# Patient Record
Sex: Female | Born: 1935 | Race: White | Hispanic: No | Marital: Single | Smoking: Former smoker
Health system: Southern US, Community
[De-identification: ages and names within clinical notes are randomized; demographics above are authoritative.]

## PROBLEM LIST (undated history)

## (undated) DIAGNOSIS — E611 Iron deficiency: Secondary | ICD-10-CM

## (undated) DIAGNOSIS — K589 Irritable bowel syndrome without diarrhea: Secondary | ICD-10-CM

## (undated) HISTORY — DX: Irritable bowel syndrome without diarrhea: K58.9

## (undated) HISTORY — DX: Iron deficiency: E61.1

## (undated) HISTORY — PX: OTHER SURGICAL HISTORY: SHX169

---

## 2017-05-06 ENCOUNTER — Other Ambulatory Visit: Payer: Self-pay | Admitting: Gerontology

## 2017-05-06 ENCOUNTER — Ambulatory Visit
Admission: RE | Admit: 2017-05-06 | Discharge: 2017-05-06 | Disposition: A | Discharge status: Home / Self Care | Payer: No Typology Code available for payment source | Source: Ambulatory Visit | Attending: Gerontology | Admitting: Gerontology

## 2017-05-06 DIAGNOSIS — R059 Cough, unspecified: Secondary | ICD-10-CM

## 2017-05-06 DIAGNOSIS — R05 Cough: Secondary | ICD-10-CM

## 2017-06-01 ENCOUNTER — Encounter: Payer: Self-pay | Admitting: Internal Medicine

## 2017-08-24 ENCOUNTER — Encounter: Payer: Self-pay | Admitting: Nurse Practitioner

## 2017-08-24 ENCOUNTER — Ambulatory Visit (INDEPENDENT_AMBULATORY_CARE_PROVIDER_SITE_OTHER): Payer: Medicare (Managed Care) | Admitting: Nurse Practitioner

## 2017-08-24 ENCOUNTER — Encounter

## 2017-08-24 DIAGNOSIS — R1030 Lower abdominal pain, unspecified: Secondary | ICD-10-CM

## 2017-08-24 DIAGNOSIS — R109 Unspecified abdominal pain: Secondary | ICD-10-CM | POA: Insufficient documentation

## 2017-08-24 DIAGNOSIS — R197 Diarrhea, unspecified: Secondary | ICD-10-CM | POA: Diagnosis not present

## 2017-08-24 NOTE — Progress Notes (Signed)
[facility]  [facility] Primary Gastroenterologist:  Dr. Jena Gaussourk  Chief Complaint  Patient presents with  . Diarrhea    10-12 times per day  . Abdominal Pain    all across the abd after meals    HPI:   Makayla Chavez is a 84 y.o. female who presents on referral from primary care loose bowel movements and a history of diverticulosis.  Reviewed information provided with the referral including office visit dated 05/06/2017.  At that time she appeared to be doing well.  Chronic kidney disease with creatinine 1.48 03/16/2017.  Sometime after her office visit consult was sent to our office for diverticulosis, frequent loose stools after meals, urge incontinence..  No history of colonoscopy or endoscopy in our system.  Today she is accompanied by her daughter. Today she states the diarrhea has been chronic. Just moved to the area in September 2018. Her daughter feels her loose stools have been ongoing for 10+ years. She has 12+ stools a day, typically has 3 stools after eating. Also early morning stools. Abdominal pain started about a few months ago; described as crampy, located lower abdomen, improves after bowel movement. Denies hematochezia, melena, fever, chills, unintentional weight loss. She has a history of hemorrhoids and had one episode of mild toilet tissue hematochezia. She is unsure if she's had a colonoscopy before. Her daughter states if she has, it would have been years ago. No recent antibiotics. Was in a nursing home for 6 months until she moved to this area. She has tried Benefiber 2 tsp a day. Denies chest pain, dyspnea, dizziness, lightheadedness, syncope, near syncope. Denies any other upper or lower GI symptoms.   History reviewed. No pertinent past medical history.  Past Surgical History:  Procedure Laterality Date  . NONE TO DATE     As of 08/24/17    Current Outpatient Medications  Medication Sig Dispense Refill  .  acetaminophen (TYLENOL) 650 MG CR tablet Take 650 mg by mouth every 8 (eight) hours as needed for pain.    Marland Kitchen. amLODipine (NORVASC) 5 MG tablet Take 5 mg by mouth daily.    . cholecalciferol (VITAMIN D) 1000 units tablet Take 1,000 Units by mouth daily.    Marland Kitchen. donepezil (ARICEPT) 10 MG tablet Take 10 mg by mouth at bedtime.    . furosemide (LASIX) 20 MG tablet Take 60 mg by mouth daily.    Marland Kitchen. levothyroxine (SYNTHROID, LEVOTHROID) 50 MCG tablet Take 50 mcg by mouth daily before breakfast.    . methotrexate (RHEUMATREX) 2.5 MG tablet Take 2.5 mg by mouth once a week. Caution:Chemotherapy. Protect from light.    . pantoprazole (PROTONIX) 40 MG tablet Take 40 mg by mouth daily.    . pioglitazone (ACTOS) 15 MG tablet Take 15 mg by mouth daily.    . rosuvastatin (CRESTOR) 10 MG tablet Take 10 mg by mouth daily.    . Wheat Dextrin (BENEFIBER PO) Take by mouth. As directed     No current facility-administered medications for this visit.     Allergies as of 08/24/2017 - Review Complete 08/24/2017  Allergen Reaction Noted  . Macrolides and ketolides  08/24/2017  . Sulfur  08/24/2017    Family History  Problem Relation Age of Onset  . Colon cancer Neg Hx     Social History   Socioeconomic History  . Marital status: Single    Spouse name: Not on file  . Number of children: Not on  file  . Years of education: Not on file  . Highest education level: Not on file  Occupational History  . Not on file  Social Needs  . Financial resource strain: Not on file  . Food insecurity:    Worry: Not on file    Inability: Not on file  . Transportation needs:    Medical: Not on file    Non-medical: Not on file  Tobacco Use  . Smoking status: Former Smoker    Types: Cigarettes  . Smokeless tobacco: Never Used  Substance and Sexual Activity  . Alcohol use: Never    Frequency: Never  . Drug use: Never  . Sexual activity: Not on file  Lifestyle  . Physical activity:    Days per week: Not on file     Minutes per session: Not on file  . Stress: Not on file  Relationships  . Social connections:    Talks on phone: Not on file    Gets together: Not on file    Attends religious service: Not on file    Active member of club or organization: Not on file    Attends meetings of clubs or organizations: Not on file    Relationship status: Not on file  . Intimate partner violence:    Fear of current or ex partner: Not on file    Emotionally abused: Not on file    Physically abused: Not on file    Forced sexual activity: Not on file  Other Topics Concern  . Not on file  Social History Narrative  . Not on file    Review of Systems: General: Negative for anorexia, weight loss, fever, chills, fatigue, weakness. ENT: Negative for hoarseness, difficulty swallowing. CV: Negative for chest pain, angina, palpitations, peripheral edema.  Respiratory: Negative for dyspnea at rest, cough, sputum, wheezing.  GI: See history of present illness. GU:  History of frequent UTIs.  MS: Negative for joint pain, low back pain.  Derm: Negative for rash or itching.  Endo: Negative for unusual weight change.  Heme: Negative for bruising or bleeding. Allergy: Negative for rash or hives.    Physical Exam: BP 128/77   Pulse 88   Temp 98.2 F (36.8 C) (Oral)   Ht 5\' 1"  (1.549 m)   Wt 176 lb 9.6 oz (80.1 kg)   BMI 33.37 kg/m  General:   Alert and oriented. Pleasant and cooperative. Well-nourished and well-developed.  Eyes:  Without icterus, sclera clear and conjunctiva pink.  Ears:  Normal auditory acuity. Cardiovascular:  S1, S2 present without murmurs appreciated. Extremities without clubbing or edema. Respiratory:  Clear to auscultation bilaterally. No wheezes, rales, or rhonchi. No distress.  Gastrointestinal:  +BS, soft, non-tender and non-distended. No HSM noted. No guarding or rebound. No masses appreciated.  Rectal:  Deferred  Musculoskalatal:  Symmetrical without gross  deformities. Neurologic:  Alert and oriented;  grossly normal neurologically. Psych:  Alert and cooperative. Normal mood and affect. Heme/Lymph/Immune: No excessive bruising noted.    08/24/2017 2:49 PM   Disclaimer: This note was dictated with voice recognition software. Similar sounding words can inadvertently be transcribed and may not be corrected upon review.

## 2017-08-24 NOTE — Patient Instructions (Signed)
1. Pick up a stool sample kit from [facility]. 2. Collect a liquid stool sample and bring it back to the lab. 3. We will call you with the results when they are back. 4. If there is no infection I will send in the medication (likely Bentyl) to your pharmacy.  When you start Bentyl you can also try Imodium if needed on top of that. 5. Call us in 1 to 2 weeks after starting Bentyl and let us know if it is helping. 6. We can make further medication changes and adjustments over the phone to help control your symptoms. 7. Return for follow-up in our office in 2 months. 8. Call us if you have any questions or concerns.  At [facility] we value your feedback. You may receive a survey about your visit today. Please share your experience as we strive to create trusting relationships with our patients to provide genuine, compassionate, quality care.  It was great to meet both you today!  I hope you have a great summer!!

## 2017-08-27 LAB — GASTROINTESTINAL PATHOGEN PANEL PCR
C. difficile Tox A/B, PCR: NOT DETECTED
CRYPTOSPORIDIUM, PCR: NOT DETECTED
Campylobacter, PCR: NOT DETECTED
E coli (ETEC) LT/ST PCR: NOT DETECTED
E coli (STEC) stx1/stx2, PCR: NOT DETECTED
E coli 0157, PCR: NOT DETECTED
GIARDIA LAMBLIA, PCR: NOT DETECTED
NOROVIRUS, PCR: NOT DETECTED
ROTAVIRUS, PCR: NOT DETECTED
Salmonella, PCR: NOT DETECTED
Shigella, PCR: NOT DETECTED

## 2017-08-28 NOTE — Assessment & Plan Note (Signed)
Noted abdominal pain lower abdomen described as crampy and improvement after bowel movement.  Is associated with diarrhea, acute on chronic as per above.  If her GI pathogen panel is negative we can trial Bentyl for diarrhea and abdominal pain relief.  Viberzi is an additional option.  Return for follow-up in 2 months.

## 2017-08-28 NOTE — Assessment & Plan Note (Signed)
The patient likely has chronic diarrhea for 10+ years.  She is currently having significant acute on chronic episode with 12+ stools a day, typically 3 stools after eating a meal.  Also early morning stools.  Abdominal pain associated with this in the lower abdomen described as crampy.  She is unsure if she had a colonoscopy before and her daughter states that she has it would be years ago.  No recent antibiotics but has been in a nursing home in the near past.  [place] ServiceBenefiber has not helped.  At this point I will check a GI pathogen panel.  If she has no infection we can treat with Bentyl.  If no improvement on Bentyl, consider Viberzi as a second option.  I requested a progress report 1 to 2 weeks.  Imodium remains another option either in combination or and so low.  Follow-up in 2 months.

## 2017-08-31 ENCOUNTER — Telehealth: Payer: Self-pay | Admitting: Internal Medicine

## 2017-08-31 NOTE — Telephone Encounter (Signed)
PATIENT DAUGHTER CALLED INQUIRING ABOUT HER MOTHERS LAB RESULTS FROM LAST WEEK

## 2017-08-31 NOTE — Telephone Encounter (Signed)
Spoke with daughter, she was notified of results.

## 2017-08-31 NOTE — Telephone Encounter (Signed)
Tried calling, no answer. Waiting on a return call.

## 2017-08-31 NOTE — Progress Notes (Signed)
CC'D TO PCP °

## 2017-09-03 ENCOUNTER — Telehealth: Payer: Self-pay | Admitting: Internal Medicine

## 2017-09-03 NOTE — Telephone Encounter (Signed)
[facility] (Dr Lyla Sonarrie) called asking to speak with EG regarding the patient that he had seen on 08/24/17. Please call her back at [phone]

## 2017-09-03 NOTE — Telephone Encounter (Signed)
EG, Dr. Lyla Sonarrie would like to know the dose and instructions for Bentyl medication.

## 2017-09-03 NOTE — Telephone Encounter (Signed)
Attempted call back. Left message x 1.

## 2017-09-03 NOTE — Telephone Encounter (Signed)
Spoke with Dr. Izetta Dakinarrie Fernald, DNP, AGNP-C about Bentyl and other options ([facility] pays for the patient's medications). She can titrate as needed. Refer back if needed further assistance.

## 2017-10-30 ENCOUNTER — Ambulatory Visit: Payer: Medicare (Managed Care) | Admitting: Nurse Practitioner

## 2017-11-23 ENCOUNTER — Encounter: Payer: Self-pay | Admitting: Nurse Practitioner

## 2017-11-23 ENCOUNTER — Ambulatory Visit (INDEPENDENT_AMBULATORY_CARE_PROVIDER_SITE_OTHER): Payer: Medicare (Managed Care) | Admitting: Nurse Practitioner

## 2017-11-23 VITALS — BP 142/69 | HR 81 | Temp 97.2°F | Ht 61.0 in | Wt 175.4 lb

## 2017-11-23 DIAGNOSIS — R197 Diarrhea, unspecified: Secondary | ICD-10-CM

## 2017-11-23 DIAGNOSIS — R1030 Lower abdominal pain, unspecified: Secondary | ICD-10-CM | POA: Diagnosis not present

## 2017-11-23 NOTE — Patient Instructions (Addendum)
1. We will call and discuss her medications with Izetta Dakin, NP at [facility] 2. I will ask her if she can change your Bentyl to pill form. 3. We will likely increase her Bentyl to see if we can better control her symptoms. 4. Alternatively you could add Imodium as needed. 5. You can attempt elimination diets where you eliminate all dairy and see if this helps her symptoms.  The same goes for artificial sweeteners. 6. Return for follow-up in 3 months. 7. Further recommendations will follow.  At [facility] we value your feedback. You may receive a survey about your visit today. Please share your experience as we strive to create trusting relationships with our patients to provide genuine, compassionate, quality care.  We appreciate your understanding and patience as we review any laboratory studies, imaging, and other diagnostic tests that are ordered as we care for you. Our office policy is 5 business days for review of these results, and any emergent or urgent results are addressed in a timely manner for your best interest. If you do not hear from our office in 1 week, please contact us.   We also encourage the use of MyChart, which contains your medical information for your review as well. If you are not enrolled in this feature, an access code is on this after visit summary for your convenience. Thank you for allowing [place] to be involved in your care.  It was great to see you both today!  I hope you have a great Fall!!

## 2017-11-23 NOTE — Assessment & Plan Note (Signed)
The patient's abdominal pain seems improved.  However, at some point during the visit the patient stated that her abdominal pain is persistent but she cannot quantify how often despite being asked multiple times.  She states it is crampy.  She likely has IBS, as previously discussed.  She is on Bentyl but on pretty low dose.  I will discuss with her nurse practitioner at [facility] about up titrating her medication.  Follow-up in 3 months.

## 2017-11-23 NOTE — Assessment & Plan Note (Signed)
On the surface it appears the patient is doing better with diarrhea.  She told the nurse she is having 1 stool a day.  However, at some point her daughter states that she was doing better and is now on the decline.  She is taking Bentyl 5 mg twice daily in liquid form.  They are requesting pill form.  Her abdominal pain is improved.  The patient's daughter states she is having 3 loose bowel movements after every meal, which seems inconsistent with what was previously stated.  At this point I will call and discuss her situation with a nurse practitioner from [facility].  I will ask if Bentyl can be changed to pill form.  Recommend Bentyl 10 mg 3 times a day before meals.  Also recommended attempt elimination diet with dairy and with artificial sweeteners to see if this has any improvement.  Alternatively she can add Imodium as needed.  Follow-up in 3 months.

## 2017-11-23 NOTE — Progress Notes (Signed)
CC'D TO PCP °

## 2017-11-23 NOTE — Progress Notes (Signed)
Referring Provider: [facility] Primary Care Physician:  [facility] Primary GI:  Dr. Jena Gauss  Chief Complaint  Patient presents with  . Abdominal Pain    no longer an issue  . Diarrhea    once a day, watery stool, no blood    HPI:   Makayla Chavez is a 84 y.o. female who presents for follow-up on abdominal pain.  The patient was last seen in our office 08/24/2017 for lower abdominal pain and diarrhea.  Noted history of chronic kidney disease.  No history of colonoscopy or endoscopy in our system.  At her visit she was accompanied by her daughter.  Diarrhea has been chronic, just moved to the area in September 2018.  Typically 12+ stools a day, 3 stools after meals, this is been ongoing for 10 or more years.  Abdominal pain started a few months ago which is crampy and lower abdominal which improves after bowel movement.  History of hemorrhoids with one previous history of toilet tissue hematochezia.  Unsure if she has had a colonoscopy and her daughter states if so it would be numerous years prior.  Has tried Benefiber 2 teaspoons a day.  No other GI symptoms.  Amended stool studies, consider antidiarrheal such as Bentyl if no occult infection, follow-up 1 to 2 weeks after Bentyl, follow-up in 2 months.  GI pathogen panel was completed 08/25/2017 and was negative.  Bentyl was sent to the patient's pharmacy. [facility] NP indicated she would handle the ordering of Bentyl and medication titration as needed.  Today she is accompanied by her daughter.  Today she states she's doing ok overall. Her daughter states she's doing "some what better" and notes she's back on the decline with diarrhea every day, about once a day (compared to 12 diarrhea stools a day). Pain initially was said to be gone. Patient states "not really" and unable to quantify. Denies N/V, hematochezia, melena, fever, chills, unintentional weight loss. She is eating well. Denies chest pain,  dyspnea, dizziness, lightheadedness, syncope, near syncope. Denies any other upper or lower GI symptoms.  History reviewed. No pertinent past medical history.  Past Surgical History:  Procedure Laterality Date  . NONE TO DATE     As of 08/24/17    Current Outpatient Medications  Medication Sig Dispense Refill  . acetaminophen (TYLENOL) 650 MG CR tablet Take 650 mg by mouth every 8 (eight) hours as needed for pain.    Marland Kitchen amLODipine (NORVASC) 5 MG tablet Take 5 mg by mouth daily.    . cholecalciferol (VITAMIN D) 1000 units tablet Take 1,000 Units by mouth daily.    Marland Kitchen donepezil (ARICEPT) 10 MG tablet Take 10 mg by mouth at bedtime.    . furosemide (LASIX) 20 MG tablet Alternates between 60-80mg     . levothyroxine (SYNTHROID, LEVOTHROID) 50 MCG tablet Take 50 mcg by mouth daily before breakfast.    . methotrexate (RHEUMATREX) 2.5 MG tablet Take 2.5 mg by mouth once a week. Caution:Chemotherapy. Protect from light.    . pantoprazole (PROTONIX) 40 MG tablet Take 40 mg by mouth daily.    . pioglitazone (ACTOS) 15 MG tablet Take 15 mg by mouth daily.    . rosuvastatin (CRESTOR) 10 MG tablet Take 10 mg by mouth daily.    . Wheat Dextrin (BENEFIBER PO) Take by mouth. As directed     No current facility-administered medications for this visit.     Allergies as of 11/23/2017 -  Review Complete 11/23/2017  Allergen Reaction Noted  . Macrolides and ketolides  08/24/2017  . Sulfur  08/24/2017    Family History  Problem Relation Age of Onset  . Colon cancer Neg Hx     Social History   Socioeconomic History  . Marital status: Single    Spouse name: Not on file  . Number of children: Not on file  . Years of education: Not on file  . Highest education level: Not on file  Occupational History  . Not on file  Social Needs  . Financial resource strain: Not on file  . Food insecurity:    Worry: Not on file    Inability: Not on file  . Transportation needs:    Medical: Not on file     Non-medical: Not on file  Tobacco Use  . Smoking status: Former Smoker    Types: Cigarettes  . Smokeless tobacco: Never Used  Substance and Sexual Activity  . Alcohol use: Never    Frequency: Never  . Drug use: Never  . Sexual activity: Not on file  Lifestyle  . Physical activity:    Days per week: Not on file    Minutes per session: Not on file  . Stress: Not on file  Relationships  . Social connections:    Talks on phone: Not on file    Gets together: Not on file    Attends religious service: Not on file    Active member of club or organization: Not on file    Attends meetings of clubs or organizations: Not on file    Relationship status: Not on file  Other Topics Concern  . Not on file  Social History Narrative  . Not on file    Review of Systems: General: Negative for anorexia, weight loss, fever, chills, fatigue, weakness. ENT: Negative for hoarseness, difficulty swallowing. CV: Negative for chest pain, angina, palpitations, peripheral edema.  Respiratory: Negative for dyspnea at rest, cough, sputum, wheezing.  GI: See history of present illness. Endo: Negative for unusual weight change.  Heme: Negative for bruising or bleeding.   Physical Exam: BP (!) 142/69   Pulse 81   Temp (!) 97.2 F (36.2 C) (Oral)   Ht 5\' 1"  (1.549 m)   Wt 175 lb 6.4 oz (79.6 kg)   BMI 33.14 kg/m  General:   Alert and oriented. Pleasant and cooperative. Well-nourished and well-developed.  Eyes:  Without icterus, sclera clear and conjunctiva pink.  Ears:  Normal auditory acuity. Cardiovascular:  S1, S2 present without murmurs appreciated. Extremities without clubbing or edema. Respiratory:  Clear to auscultation bilaterally. No wheezes, rales, or rhonchi. No distress.  Gastrointestinal:  +BS, soft, non-tender and non-distended. No HSM noted. No guarding or rebound. No masses appreciated.  Rectal:  Deferred  Musculoskalatal:  Symmetrical without gross deformities. Skin:  Intact  without significant lesions or rashes. Neurologic:  Alert and oriented x4;  grossly normal neurologically. Psych:  Alert and cooperative. Normal mood and affect. Heme/Lymph/Immune: No excessive bruising noted.    11/23/2017 8:31 AM   Disclaimer: This note was dictated with voice recognition software. Similar sounding words can inadvertently be transcribed and may not be corrected upon review.

## 2018-02-08 ENCOUNTER — Other Ambulatory Visit (HOSPITAL_COMMUNITY): Payer: Self-pay | Admitting: *Deleted

## 2018-02-09 ENCOUNTER — Ambulatory Visit (HOSPITAL_COMMUNITY)
Admission: RE | Admit: 2018-02-09 | Discharge: 2018-02-09 | Disposition: A | Discharge status: Home / Self Care | Payer: Medicare (Managed Care) | Source: Ambulatory Visit | Attending: Gerontology | Admitting: Gerontology

## 2018-02-09 DIAGNOSIS — D509 Iron deficiency anemia, unspecified: Secondary | ICD-10-CM | POA: Diagnosis not present

## 2018-02-09 MED ORDER — FERUMOXYTOL INJECTION 510 MG/17 ML
510.0000 mg | INTRAVENOUS | Status: DC
  Administered 2018-02-09: 510 mg via INTRAVENOUS
  Filled 2018-02-09: qty 17

## 2018-02-09 NOTE — Discharge Instructions (Signed)

## 2018-02-16 ENCOUNTER — Ambulatory Visit (HOSPITAL_COMMUNITY)
Admission: RE | Admit: 2018-02-16 | Discharge: 2018-02-16 | Disposition: A | Discharge status: Home / Self Care | Payer: Medicare (Managed Care) | Source: Ambulatory Visit | Attending: Gerontology | Admitting: Gerontology

## 2018-02-16 DIAGNOSIS — D509 Iron deficiency anemia, unspecified: Secondary | ICD-10-CM | POA: Diagnosis present

## 2018-02-16 MED ORDER — FERUMOXYTOL INJECTION 510 MG/17 ML
510.0000 mg | INTRAVENOUS | Status: AC
  Administered 2018-02-16: 510 mg via INTRAVENOUS
  Filled 2018-02-16: qty 510

## 2018-02-24 ENCOUNTER — Ambulatory Visit (INDEPENDENT_AMBULATORY_CARE_PROVIDER_SITE_OTHER): Payer: Medicare (Managed Care) | Admitting: Nurse Practitioner

## 2018-02-24 ENCOUNTER — Encounter: Payer: Self-pay | Admitting: Nurse Practitioner

## 2018-02-24 VITALS — BP 115/67 | HR 84 | Temp 97.1°F | Ht 61.0 in | Wt 164.6 lb

## 2018-02-24 DIAGNOSIS — R1030 Lower abdominal pain, unspecified: Secondary | ICD-10-CM | POA: Diagnosis not present

## 2018-02-24 DIAGNOSIS — R197 Diarrhea, unspecified: Secondary | ICD-10-CM | POA: Diagnosis not present

## 2018-02-24 NOTE — Assessment & Plan Note (Signed)
Abdominal pain is resolved at this time.  Likely irritable bowel syndrome his next measure for abdominal pain and diarrhea.  Bentyl is working well as per above and further diarrhea management noted above.  Return for follow-up in 3 months.

## 2018-02-24 NOTE — Assessment & Plan Note (Signed)
Overall diarrhea is improved but not resolved.  Still having 1-3 bowel movements a day with at least 1 of these being diarrhea and the other is quite soft.  She is taking Bentyl twice a day.  Her medication is managed by [facility].  I recommended adding Imodium 1-2 times a day in addition to Bentyl to see if we can have some improved effect.  Continue Bentyl at twice a day dosing.  Follow-up in 3 months.

## 2018-02-24 NOTE — Progress Notes (Signed)
cc'ed to pcp °

## 2018-02-24 NOTE — Patient Instructions (Signed)
Your health issues we discussed today were:   Abdominal pain and diarrhea 1. Continue taking Bentyl twice a day 2. You can add Imodium (loperamide) 1-2 times a day as needed to help improve diarrhea further. 3. Call us for any worsening symptoms.  Overall I recommend:  1. Return for follow-up in 3 months. 2. Call us if you have any questions or concerns.  At [facility] we value your feedback. You may receive a survey about your visit today. Please share your experience as we strive to create trusting relationships with our patients to provide genuine, compassionate, quality care.  We appreciate your understanding and patience as we review any laboratory studies, imaging, and other diagnostic tests that are ordered as we care for you. Our office policy is 5 business days for review of these results, and any emergent or urgent results are addressed in a timely manner for your best interest. If you do not hear from our office in 1 week, please contact us.   We also encourage the use of MyChart, which contains your medical information for your review as well. If you are not enrolled in this feature, an access code is on this after visit summary for your convenience. Thank you for allowing [place] to be involved in your care.  It was great to see you today!  I hope you have a great day!!

## 2018-02-24 NOTE — Progress Notes (Signed)
Referring Provider: Inc, [facility] Primary Care Physician:  Inc, [facility] Primary GI:  Dr. Jena Gauss  Chief Complaint  Patient presents with  . Diarrhea    not any worse but not any better. Stool watery, dark brown for the past 2 days  . Abdominal Pain    denies    HPI:   Makayla Chavez is a 85 y.o. female who presents for follow-up on abdominal pain and diarrhea.  The patient was last seen in our office 11/23/2017 for the same.  Noted history of chronic kidney disease.  Diarrhea noted to be chronic.  Multiple stools a day previously, GI pathogen panel completed negative, Bentyl sent to the pharmacy and [facility] NP indicated they would take over ordering of Bentyl and medication titration as needed.  At her last visit she was accompanied by her daughter and doing well overall.  There is some question whether she was having persistent pain as a initially indicated that it was gone but then the patient said "not really" and unable to quantify upon further discussion.  Previously having 12 stools a day, down to about 1 stool a day.  No other GI symptoms.  Recommended we will discuss her medication with the nurse practitioner at [facility], change Bentyl to pill form per patient request, likely increase Bentyl to control symptoms, can use Imodium as well, attempt elimination diets and eliminate artificial sweeteners, follow-up in 3 months.  Today she states she's doing ok overall. Diarrhea somewhat increased up to 3 times a day and comes in spurts. When stools are not diarrhea will be "soft"; typically at least 1 stools a day will be diarrhea. Only using Bentyl, dosed by Lyla Son, NP at [facility]. She previously had urgency and incontinence; now she is able to control her bowel movement. Abdominal pain is improved and essentially resolved. Denies hematochezia, melena, fever, chills, unintentional weight loss. Has a good appetite. Objectively she is down 10 lbs bt recently had some  iron deficiency and was transfused IV iron x 2. Denies chest pain, dyspnea, dizziness, lightheadedness, syncope, near syncope. Denies any other upper or lower GI symptoms.  Past Medical History:  Diagnosis Date  . IBS (irritable bowel syndrome)   . Iron deficiency    IV iron x 2 in 2019/2020    Past Surgical History:  Procedure Laterality Date  . NONE TO DATE     As of 08/24/17    Current Outpatient Medications  Medication Sig Dispense Refill  . acetaminophen (TYLENOL) 650 MG CR tablet Take 650 mg by mouth 2 (two) times daily.     Marland Kitchen amLODipine (NORVASC) 5 MG tablet Take 5 mg by mouth daily.    . cholecalciferol (VITAMIN D) 1000 units tablet Take 1,000 Units by mouth daily.    . Dicyclomine HCl (BENTYL PO) Take 1 tablet by mouth 2 (two) times daily.    Marland Kitchen donepezil (ARICEPT) 10 MG tablet Take 10 mg by mouth at bedtime.    . furosemide (LASIX) 20 MG tablet Alternates between 60-80mg     . levothyroxine (SYNTHROID, LEVOTHROID) 50 MCG tablet Take 50 mcg by mouth daily before breakfast.    . methotrexate (RHEUMATREX) 2.5 MG tablet Take 2.5 mg by mouth once a week. Caution:Chemotherapy. Protect from light.    . pantoprazole (PROTONIX) 40 MG tablet Take 40 mg by mouth daily.    . pioglitazone (ACTOS) 15 MG tablet Take 15 mg by mouth daily.    . rosuvastatin (CRESTOR)  10 MG tablet Take 10 mg by mouth daily.    . Wheat Dextrin (BENEFIBER PO) Take by mouth. As directed     No current facility-administered medications for this visit.     Allergies as of 02/24/2018 - Review Complete 02/24/2018  Allergen Reaction Noted  . Macrolides and ketolides  08/24/2017  . Sulfur  08/24/2017    Family History  Problem Relation Age of Onset  . Colon cancer Neg Hx     Social History   Socioeconomic History  . Marital status: Single    Spouse name: Not on file  . Number of children: Not on file  . Years of education: Not on file  . Highest education level: Not on file  Occupational History  .  Not on file  Social Needs  . Financial resource strain: Not on file  . Food insecurity:    Worry: Not on file    Inability: Not on file  . Transportation needs:    Medical: Not on file    Non-medical: Not on file  Tobacco Use  . Smoking status: Former Smoker    Types: Cigarettes  . Smokeless tobacco: Never Used  Substance and Sexual Activity  . Alcohol use: Never    Frequency: Never  . Drug use: Never  . Sexual activity: Not on file  Lifestyle  . Physical activity:    Days per week: Not on file    Minutes per session: Not on file  . Stress: Not on file  Relationships  . Social connections:    Talks on phone: Not on file    Gets together: Not on file    Attends religious service: Not on file    Active member of club or organization: Not on file    Attends meetings of clubs or organizations: Not on file    Relationship status: Not on file  Other Topics Concern  . Not on file  Social History Narrative  . Not on file    Review of Systems: General: Negative for anorexia, weight loss, fever, chills, fatigue, weakness. ENT: Negative for hoarseness, difficulty swallowing. CV: Negative for chest pain, angina, palpitations, peripheral edema.  Respiratory: Negative for dyspnea at rest, cough, sputum, wheezing.  GI: See history of present illness. Endo: Negative for unusual weight change.  Heme: Negative for bruising or bleeding. Allergy: Negative for rash or hives.   Physical Exam: BP 115/67   Pulse 84   Temp (!) 97.1 F (36.2 C) (Oral)   Ht 5\' 1"  (1.549 m)   Wt 164 lb 9.6 oz (74.7 kg)   BMI 31.10 kg/m  General:   Alert and oriented. Pleasant and cooperative. Well-nourished and well-developed.  Eyes:  Without icterus, sclera clear and conjunctiva pink.  Ears:  Normal auditory acuity. Cardiovascular:  S1, S2 present without murmurs appreciated. Extremities without clubbing or edema. Respiratory:  Clear to auscultation bilaterally. No wheezes, rales, or rhonchi. No  distress.  Gastrointestinal:  +BS, soft, non-tender and non-distended. No HSM noted. No guarding or rebound. No masses appreciated.  Rectal:  Deferred  Musculoskalatal:  Symmetrical without gross deformities. Neurologic:  Alert and oriented x4;  grossly normal neurologically. Psych:  Alert and cooperative. Normal mood and affect. Heme/Lymph/Immune: No excessive bruising noted.    02/24/2018 8:51 AM   Disclaimer: This note was dictated with voice recognition software. Similar sounding words can inadvertently be transcribed and may not be corrected upon review.

## 2018-03-08 ENCOUNTER — Encounter: Payer: Self-pay | Admitting: Internal Medicine

## 2018-05-27 ENCOUNTER — Other Ambulatory Visit: Payer: Self-pay

## 2018-05-27 ENCOUNTER — Encounter: Payer: Self-pay | Admitting: Nurse Practitioner

## 2018-05-27 ENCOUNTER — Ambulatory Visit: Payer: Medicare (Managed Care) | Admitting: Nurse Practitioner

## 2018-05-27 ENCOUNTER — Ambulatory Visit (INDEPENDENT_AMBULATORY_CARE_PROVIDER_SITE_OTHER): Payer: Medicare (Managed Care) | Admitting: Nurse Practitioner

## 2018-05-27 DIAGNOSIS — R197 Diarrhea, unspecified: Secondary | ICD-10-CM

## 2018-05-27 DIAGNOSIS — R1030 Lower abdominal pain, unspecified: Secondary | ICD-10-CM

## 2018-05-27 NOTE — Patient Instructions (Signed)
Your health issues we discussed today were:   Diarrhea: 1. Continue taking Bentyl twice a day 2. Continue using Imodium once a day.  You can add a second Imodium here and there, as needed for any diarrhea symptoms 3. Call us if you have any worsening or severe symptoms  Overall I recommend:  1. Continue your other medications otherwise 2. Call us if you have any questions or concerns 3. Follow-up in 6 months   Because of recent events of COVID-19 ("Coronavirus"), follow CDC recommendations:  1. Wash your hand frequently 2. Avoid touching your face 3. Stay away from people who are sick 4. If you have symptoms such as fever, cough, shortness of breath then call your healthcare provider for further guidance 5. If you are sick, STAY AT HOME unless otherwise directed by your healthcare provider. 6. Follow directions from state and national officials regarding staying safe   At [facility] we value your feedback. You may receive a survey about your visit today. Please share your experience as we strive to create trusting relationships with our patients to provide genuine, compassionate, quality care.  We appreciate your understanding and patience as we review any laboratory studies, imaging, and other diagnostic tests that are ordered as we care for you. Our office policy is 5 business days for review of these results, and any emergent or urgent results are addressed in a timely manner for your best interest. If you do not hear from our office in 1 week, please contact us.   We also encourage the use of MyChart, which contains your medical information for your review as well. If you are not enrolled in this feature, an access code is on this after visit summary for your convenience. Thank you for allowing [place] to be involved in your care.  It was great to see you today!  I hope you have a great day!!

## 2018-05-27 NOTE — Assessment & Plan Note (Signed)
Abdominal pain remains at bay at this time.  Recommend she call us for any recurrent abdominal pain.  Otherwise follow-up in 6 months.

## 2018-05-27 NOTE — Progress Notes (Signed)
Referring Provider: [facility]:  [facility]:  Dr. Jena Gaussourk  NOTE: Service was provided via telemedicine and was requested by the patient due to COVID-19 pandemic.  Method of visit: Telephone  Patient Location: Home  Provider Location: Office  Reason for Phone Visit: Follow-up  The patient was consented to phone follow-up via telephone encounter including billing of the encounter (yes/no): Yes  Persons present on the phone encounter, with roles: Daughter  Total time (minutes) spent on medical discussion: 13 minutes  Chief Complaint  Patient presents with   Diarrhea    Occasional    HPI:   Makayla Chavez is a 85 y.o. female who presents for virtual visit regarding: Follow-up for diarrhea.  Patient was last seen in our office 02/24/2018 for lower abdominal pain and diarrhea.  Noted history of irritable bowel syndrome with chronic diarrhea, chronic kidney disease.  Previous GI pathogen panel negative despite multiple stools today.  Last visit diarrhea somewhat increased up to 3 times a day and comes in spurts and when not diarrhea stools are soft.  Typically at least one stool a day will be diarrhea.  Using only Bentyl as [facility] L[facility] Son(Carrie, NP).  Resolution of urgency and incontinence now able to control her stools.  Abdominal pain improved and essentially resolved.  Had some iron deficiency and was transfused IV iron x2.  No other GI symptoms.  Recommended continue Bentyl twice a day, add Imodium 1-2 times a day as needed, follow-up in 3 months.  Today she states she's doing well overall. Abdominal pain remains at bay. Has diarrhea about 3 times a week, which is much better than it was. The patient and her daughter both feel it is manageable at this state. Denies hematochezia, melena, fever, chills, unintentional weight loss. Denies URI or flu-like symptoms. Denies chest  pain, dyspnea, dizziness, lightheadedness, syncope, near syncope. Denies any other upper or lower GI symptoms.  Past Medical History:  Diagnosis Date   IBS (irritable bowel syndrome)    Iron deficiency    IV iron x 2 in 2019/2020    Past Surgical History:  Procedure Laterality Date   NONE TO DATE     As of 08/24/17    Current Outpatient Medications  Medication Sig Dispense Refill   acetaminophen (TYLENOL) 650 MG CR tablet Take 650 mg by mouth 2 (two) times daily.      amLODipine (NORVASC) 5 MG tablet Take 5 mg by mouth daily.     cholecalciferol (VITAMIN D) 1000 units tablet Take 1,000 Units by mouth daily.     Dicyclomine HCl (BENTYL PO) Take 1 tablet by mouth 2 (two) times daily.     donepezil (ARICEPT) 10 MG tablet Take 10 mg by mouth at bedtime.     furosemide (LASIX) 20 MG tablet Alternates between 60-80mg      levothyroxine (SYNTHROID, LEVOTHROID) 50 MCG tablet Take 50 mcg by mouth daily before breakfast.     loperamide (IMODIUM) 2 MG capsule Take by mouth daily.     methotrexate (RHEUMATREX) 2.5 MG tablet Take 2.5 mg by mouth once a week. Caution:Chemotherapy. Protect from light.     pantoprazole (PROTONIX) 40 MG tablet Take 40 mg by mouth daily.     pioglitazone (ACTOS) 15 MG tablet Take 15 mg by mouth daily.     rosuvastatin (CRESTOR) 10 MG tablet Take 10 mg by mouth daily.  Wheat Dextrin (BENEFIBER PO) Take by mouth daily. As directed      No current facility-administered medications for this visit.     Allergies as of 05/27/2018 - Review Complete 05/27/2018  Allergen Reaction Noted   Macrolides and ketolides  08/24/2017   Sulfur  08/24/2017    Family History  Problem Relation Age of Onset   Colon cancer Neg Hx     Social History   Socioeconomic History   Marital status: Single    Spouse name: Not on file   Number of children: Not on file   Years of education: Not on file   Highest education level: Not on file  Occupational  History   Not on file  Social Needs   Financial resource strain: Not on file   Food insecurity:    Worry: Not on file    Inability: Not on file   Transportation needs:    Medical: Not on file    Non-medical: Not on file  Tobacco Use   Smoking status: Former Smoker    Types: Cigarettes   Smokeless tobacco: Never Used  Substance and Sexual Activity   Alcohol use: Never    Frequency: Never   Drug use: Never   Sexual activity: Not on file  Lifestyle   Physical activity:    Days per week: Not on file    Minutes per session: Not on file   Stress: Not on file  Relationships   Social connections:    Talks on phone: Not on file    Gets together: Not on file    Attends religious service: Not on file    Active member of club or organization: Not on file    Attends meetings of clubs or organizations: Not on file    Relationship status: Not on file  Other Topics Concern   Not on file  Social History Narrative   Not on file    Review of Systems: General: Negative for anorexia, weight loss, fever, chills, fatigue, weakness. ENT: Negative for hoarseness, difficulty swallowing. CV: Negative for chest pain, angina, palpitations, peripheral edema.  Respiratory: Negative for dyspnea at rest, cough, sputum, wheezing.  GI: See history of present illness.  Endo: Negative for unusual weight change.  Heme: Negative for bruising or bleeding. Allergy: intermittent seasonal allergy flares due to pollen  Physical Exam: Note: limited exam due to virtual visit General:   Alert and oriented. Pleasant and cooperative. Ears:  Normal auditory acuity. Skin:  Intact without facial significant lesions or rashes. Neurologic:  Alert and oriented x4 Psych:  Alert and cooperative. Normal mood and affect.

## 2018-05-27 NOTE — Assessment & Plan Note (Signed)
Still some persistent diarrhea about 3 times a week.  The patient is currently taking Bentyl twice a day and Imodium once a day.  They are comfortable with where her symptoms are at this time.  I discussed titration of medications based on response.  I recommended she can take a second dose of Imodium intermittently, as needed.  I would leave the Bentyl works out at this time to prevent adverse effects due to her age and anticholinergic medication.  Follow-up in 6 months.  Call for any worsening symptoms.

## 2018-05-28 ENCOUNTER — Encounter: Payer: Self-pay | Admitting: Internal Medicine

## 2018-05-28 NOTE — Progress Notes (Signed)
CC'ED TO PCP 

## 2018-11-30 ENCOUNTER — Ambulatory Visit: Payer: Medicare (Managed Care) | Admitting: Nurse Practitioner

## 2019-03-06 IMAGING — DX DG CHEST 2V
2 series · 2 of 2 positions shown · non-contrast
Comparison: No prior.

CLINICAL DATA: Chest pain.  Cough.

EXAM:
CHEST - 2 VIEW

[dg chest 2 view (1 of 2)]
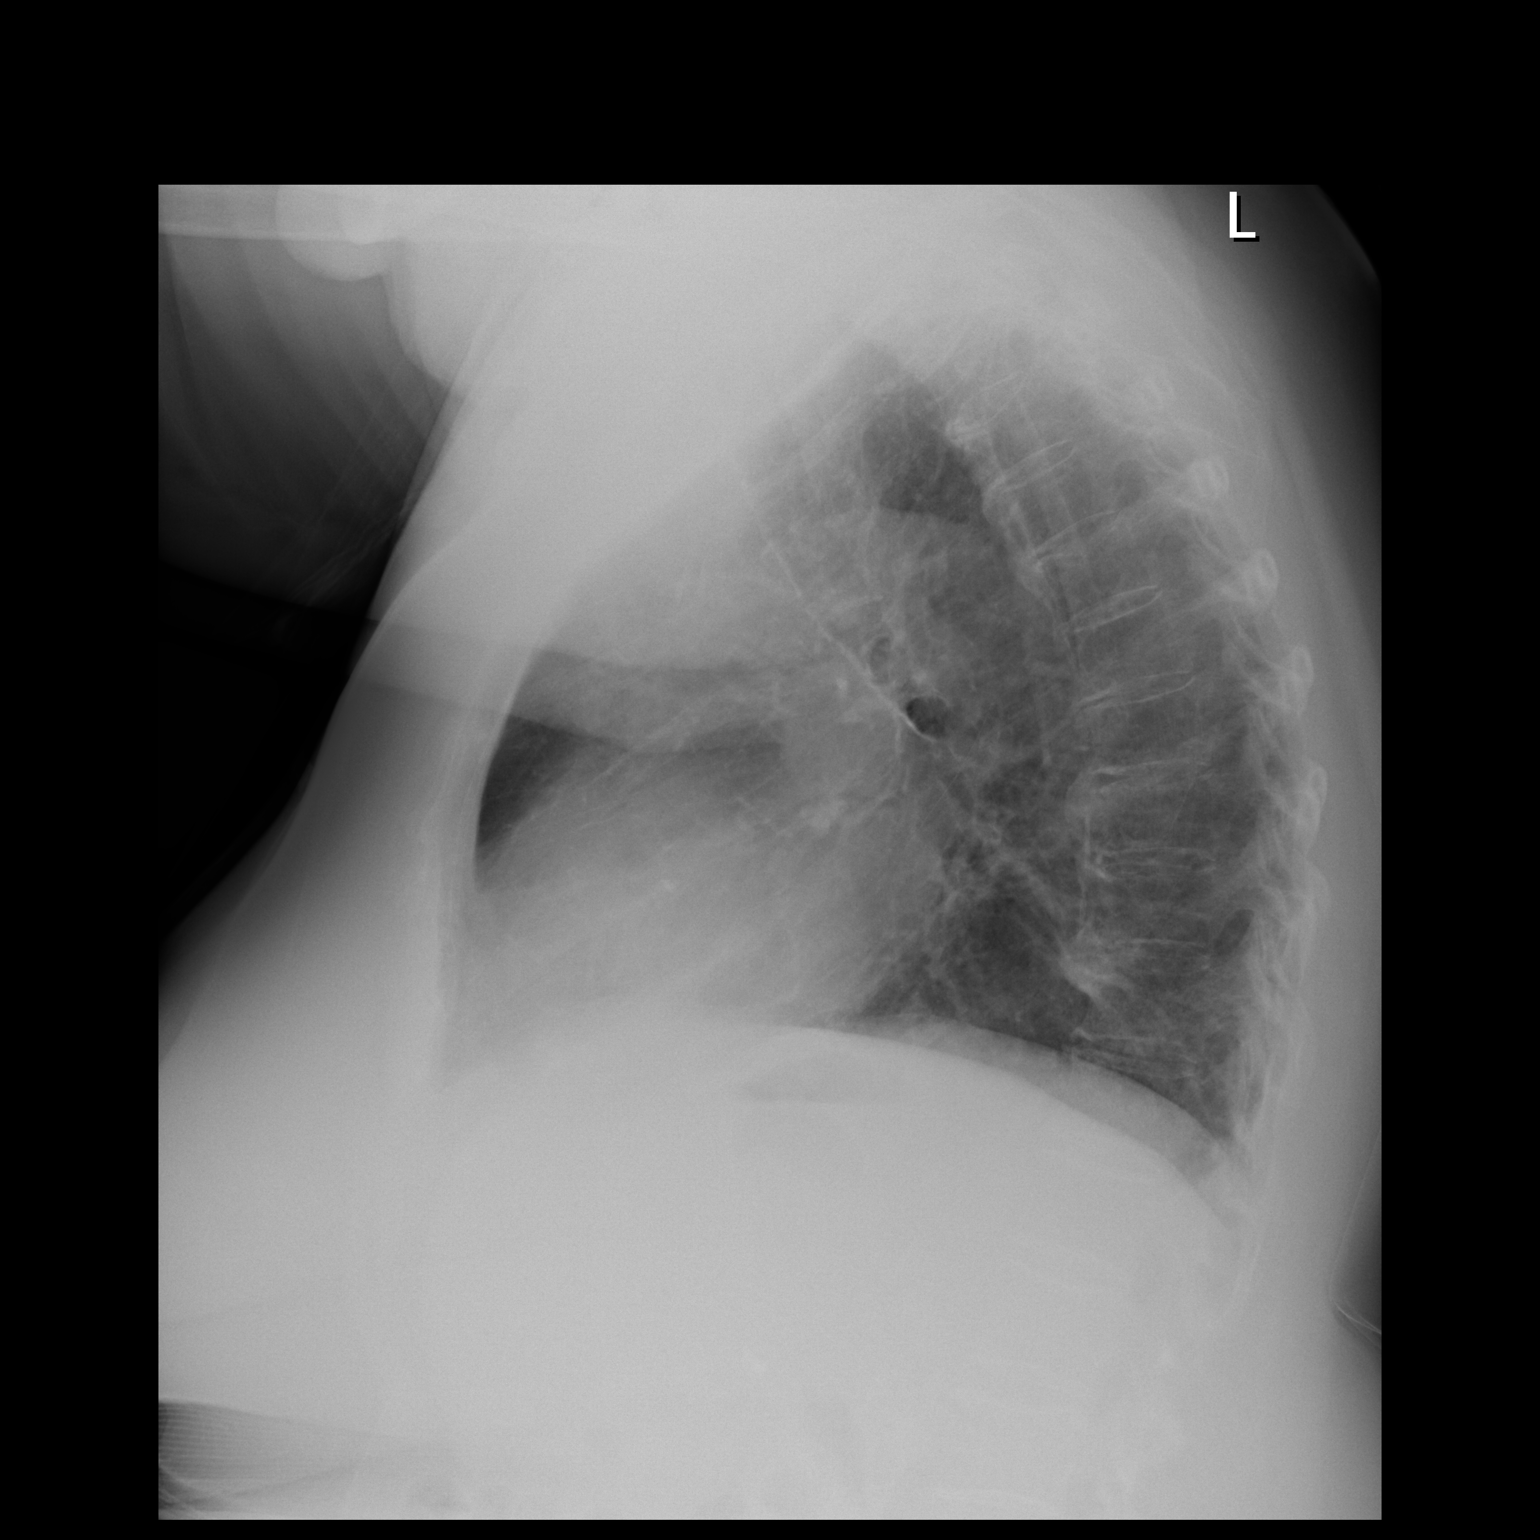

[dg chest 2 view (2 of 2)]
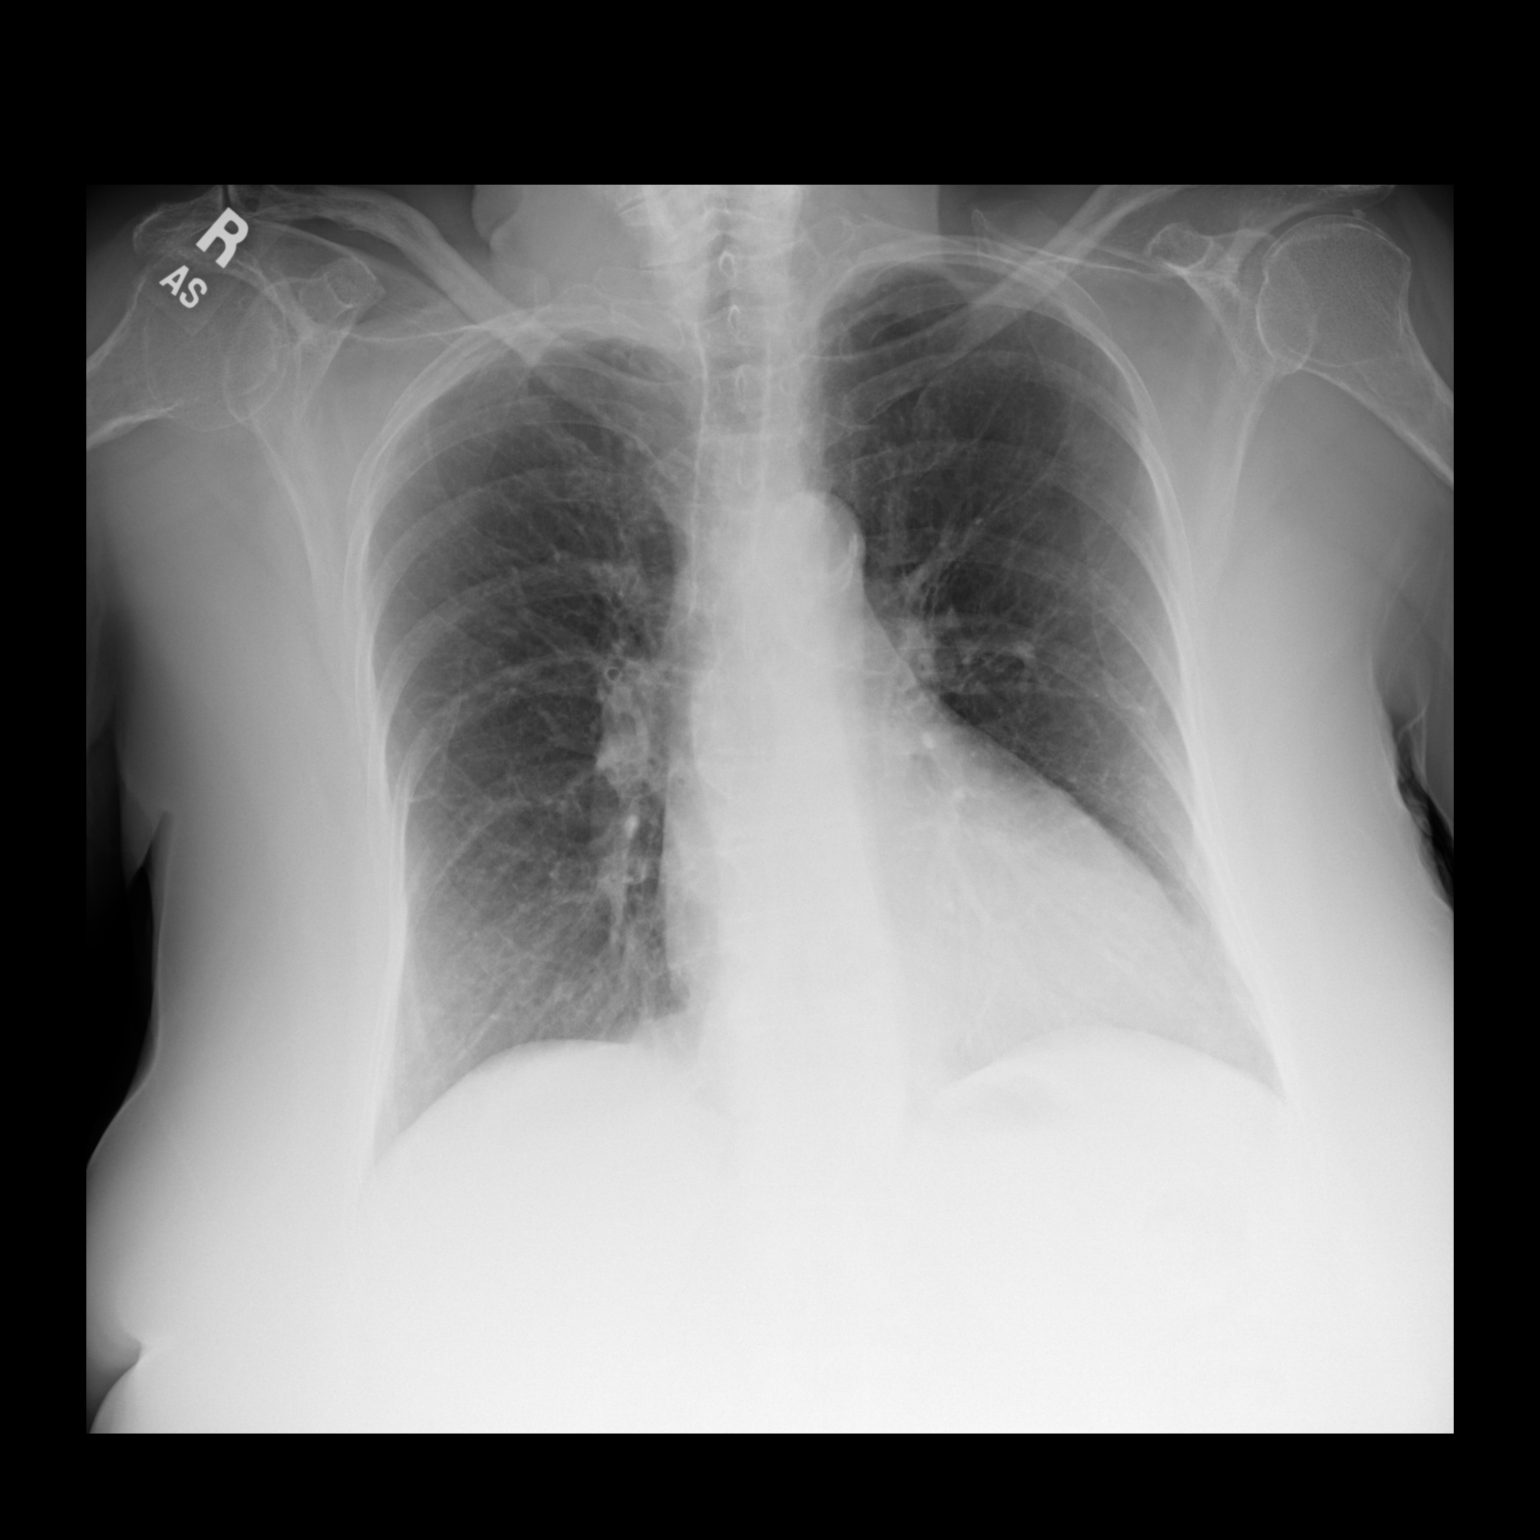

[2 of 2 positions shown; findings below may reference images not displayed]

FINDINGS: Mediastinum and hilar structures are normal. Cardiomegaly with
normal pulmonary vascularity. No focal infiltrate. No pleural
effusion or pneumothorax. No acute bony abnormality.
IMPRESSION: 1.  Stable cardiomegaly.  No pulmonary venous congestion.

2.  No acute pulmonary disease.

## 2022-06-04 DEATH — deceased
